# Patient Record
Sex: Male | Born: 1985 | Race: Black or African American | Hispanic: No | Marital: Married | Smoking: Current every day smoker
Health system: Southern US, Community
[De-identification: ages and names within clinical notes are randomized; demographics above are authoritative.]

---

## 2019-11-16 ENCOUNTER — Encounter (HOSPITAL_COMMUNITY): Payer: Self-pay | Admitting: Emergency Medicine

## 2019-11-16 ENCOUNTER — Emergency Department (HOSPITAL_COMMUNITY)
Admission: EM | Admit: 2019-11-16 | Discharge: 2019-11-16 | Disposition: A | Discharge status: Home / Self Care | Payer: Medicaid Other | Attending: Emergency Medicine | Admitting: Emergency Medicine

## 2019-11-16 ENCOUNTER — Other Ambulatory Visit: Payer: Self-pay

## 2019-11-16 ENCOUNTER — Emergency Department (HOSPITAL_COMMUNITY): Payer: Medicaid Other

## 2019-11-16 DIAGNOSIS — F17210 Nicotine dependence, cigarettes, uncomplicated: Secondary | ICD-10-CM | POA: Insufficient documentation

## 2019-11-16 DIAGNOSIS — S91105D Unspecified open wound of left lesser toe(s) without damage to nail, subsequent encounter: Secondary | ICD-10-CM | POA: Diagnosis not present

## 2019-11-16 DIAGNOSIS — S91105A Unspecified open wound of left lesser toe(s) without damage to nail, initial encounter: Secondary | ICD-10-CM

## 2019-11-16 DIAGNOSIS — X58XXXD Exposure to other specified factors, subsequent encounter: Secondary | ICD-10-CM | POA: Insufficient documentation

## 2019-11-16 NOTE — ED Provider Notes (Signed)
[facility] EMERGENCY DEPARTMENT Provider Note   CSN: [id number] Arrival date & time: 11/16/19  0005     History Chief Complaint  Patient presents with  . Foot Pain    Steven Blevins is a 34 y.o. male.  HPI     This is a 34 year old male brought in by police for medical clearance to go to jail.  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  Patient reports that he was admitted to [facility] several months ago and is continuing to take Bactrim for infection.  He states the wound generally looks better than it did.  He has not had any fevers.  He has not noted any drainage.  Wound is stable and he has no complaints.  Records reviewed from [place].  Patient has had a wound on his left toe since December 15.  He has been on multiple courses of antibiotics.  His x-rays do not show any evidence of osteomyelitis.  He was referred to wound clinic from [place].  History reviewed. No pertinent past medical history.  There are no problems to display for this patient.   History reviewed. No pertinent surgical history.     No family history on file.  Social History   Tobacco Use  . Smoking status: Current Every Day Smoker    Types: Cigarettes  . Smokeless tobacco: Never Used  Substance Use Topics  . Alcohol use: Yes  . Drug use: Not Currently    Home Medications Prior to Admission medications   Not on File    Allergies    Patient has no allergy information on record.  Review of Systems   Review of Systems  Constitutional: Negative for fever.  Skin: Positive for wound.  All other systems reviewed and are negative.   Physical Exam Updated Vital Signs BP (!) 132/106   Pulse (!) 59   Temp 97.7 F (36.5 C) (Oral)   Resp 18   Ht 1.829 m (6')   Wt 72.6 kg   SpO2 97%   BMI 21.70 kg/m   Physical Exam Vitals and nursing note reviewed.  Constitutional:      Appearance: He is well-developed. He is not ill-appearing.  HENT:     Head: Normocephalic  and atraumatic.  Cardiovascular:     Rate and Rhythm: Normal rate and regular rhythm.  Pulmonary:     Effort: Pulmonary effort is normal. No respiratory distress.     Breath sounds: No wheezing.  Musculoskeletal:     Cervical back: Neck supple.     Comments: Focused examination of the left foot with well granulated wound over the dorsal aspect of the second digit, no drainage noted, slight darkening of that digit, sensation intact, no crepitus  Lymphadenopathy:     Cervical: No cervical adenopathy.  Skin:    General: Skin is warm and dry.     Comments: See musculoskeletal above  Neurological:     Mental Status: He is alert and oriented to person, place, and time.  Psychiatric:        Mood and Affect: Mood normal.     ED Results / Procedures / Treatments   Labs (all labs ordered are listed, but only abnormal results are displayed) Labs Reviewed - No data to display  EKG None  Radiology DG Toe 2nd Left  Result Date: 11/16/2019 CLINICAL DATA:  Wound. EXAM: LEFT SECOND TOE COMPARISON:  None. FINDINGS: There is no evidence of fracture or dislocation. There is no evidence of  arthropathy or other focal bone abnormality. Soft tissues are unremarkable. IMPRESSION: Negative. Electronically Signed   By: Katherine Mantle M.D.   On: 11/16/2019 01:08    Procedures Procedures (including critical care time)  Medications Ordered in ED Medications - No data to display  ED Course  I have reviewed the triage vital signs and the nursing notes.  Pertinent labs & imaging results that were available during my care of the patient were reviewed by me and considered in my medical decision making (see chart for details).    MDM Rules/Calculators/A&P                       Patient has a chronic wound of the left toe.  Reports that it has gotten better.  Needs clearance to go to jail.  He is currently still on antibiotics.  I have reviewed his outside records and he never had evidence of  osteomyelitis.  Patient repeat x-ray does not show any evidence of focal bone abnormalities to suggest osteomyelitis.  Patient cleared to jail.  Continue antibiotics as previously prescribed.  After history, exam, and medical workup I feel the patient has been appropriately medically screened and is safe for discharge home. Pertinent diagnoses were discussed with the patient. Patient was given return precautions.   Final Clinical Impression(s) / ED Diagnoses Final diagnoses:  Open wound of second toe of left foot, initial encounter    Rx / DC Orders ED Discharge Orders    None       Abriel Geesey, Mayer Masker, MD 11/16/19 0121

## 2019-11-16 NOTE — Discharge Instructions (Addendum)
Patient is medically cleared to go to jail.  He should continue antibiotics as previously prescribed.

## 2019-11-16 NOTE — ED Triage Notes (Signed)
Pt c/o infected toe and was seen at [facility] for the same. Pt is here for medical clearance so he can go to jail.

## 2021-08-16 IMAGING — DX DG TOE 2ND 2+V*L*
3 series · 3 of 3 positions shown · non-contrast
Comparison: None.

CLINICAL DATA: Wound.

EXAM:
LEFT SECOND TOE

[toe ap]
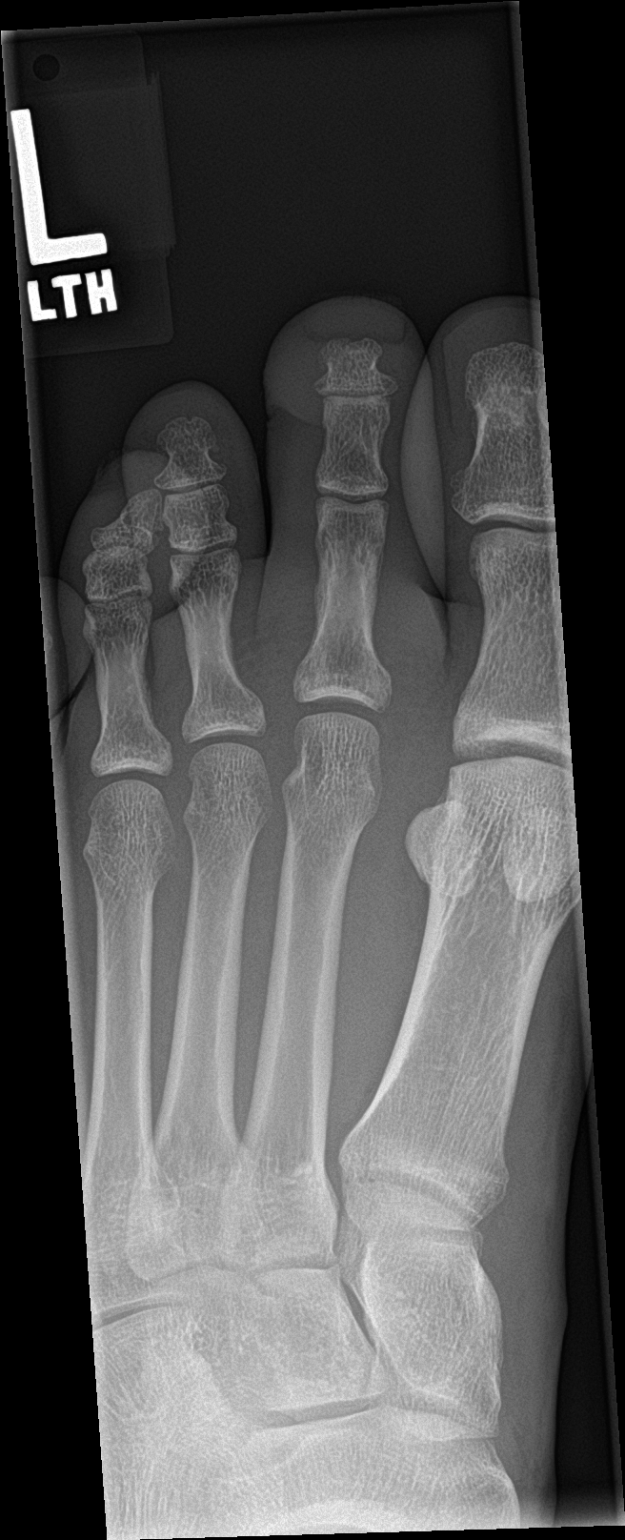

[toe obl]
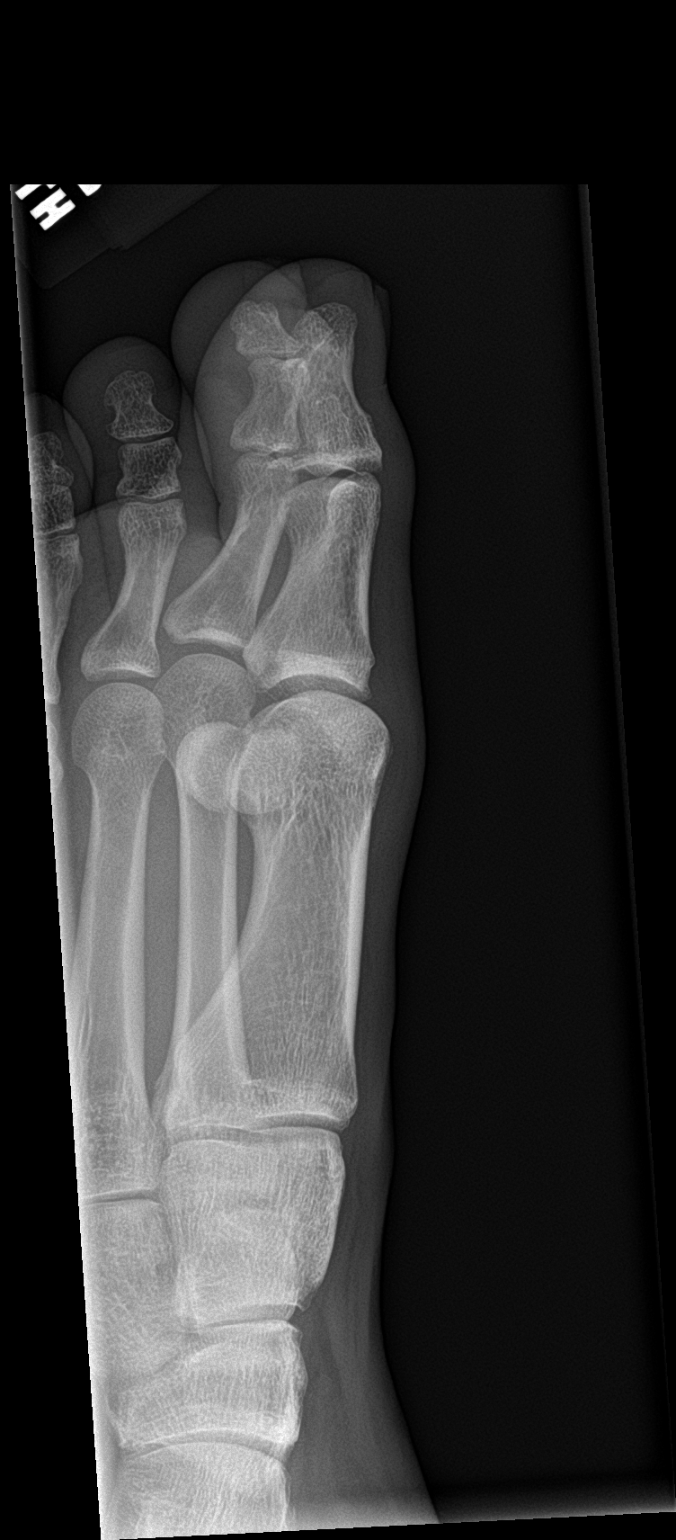

[toe lat]
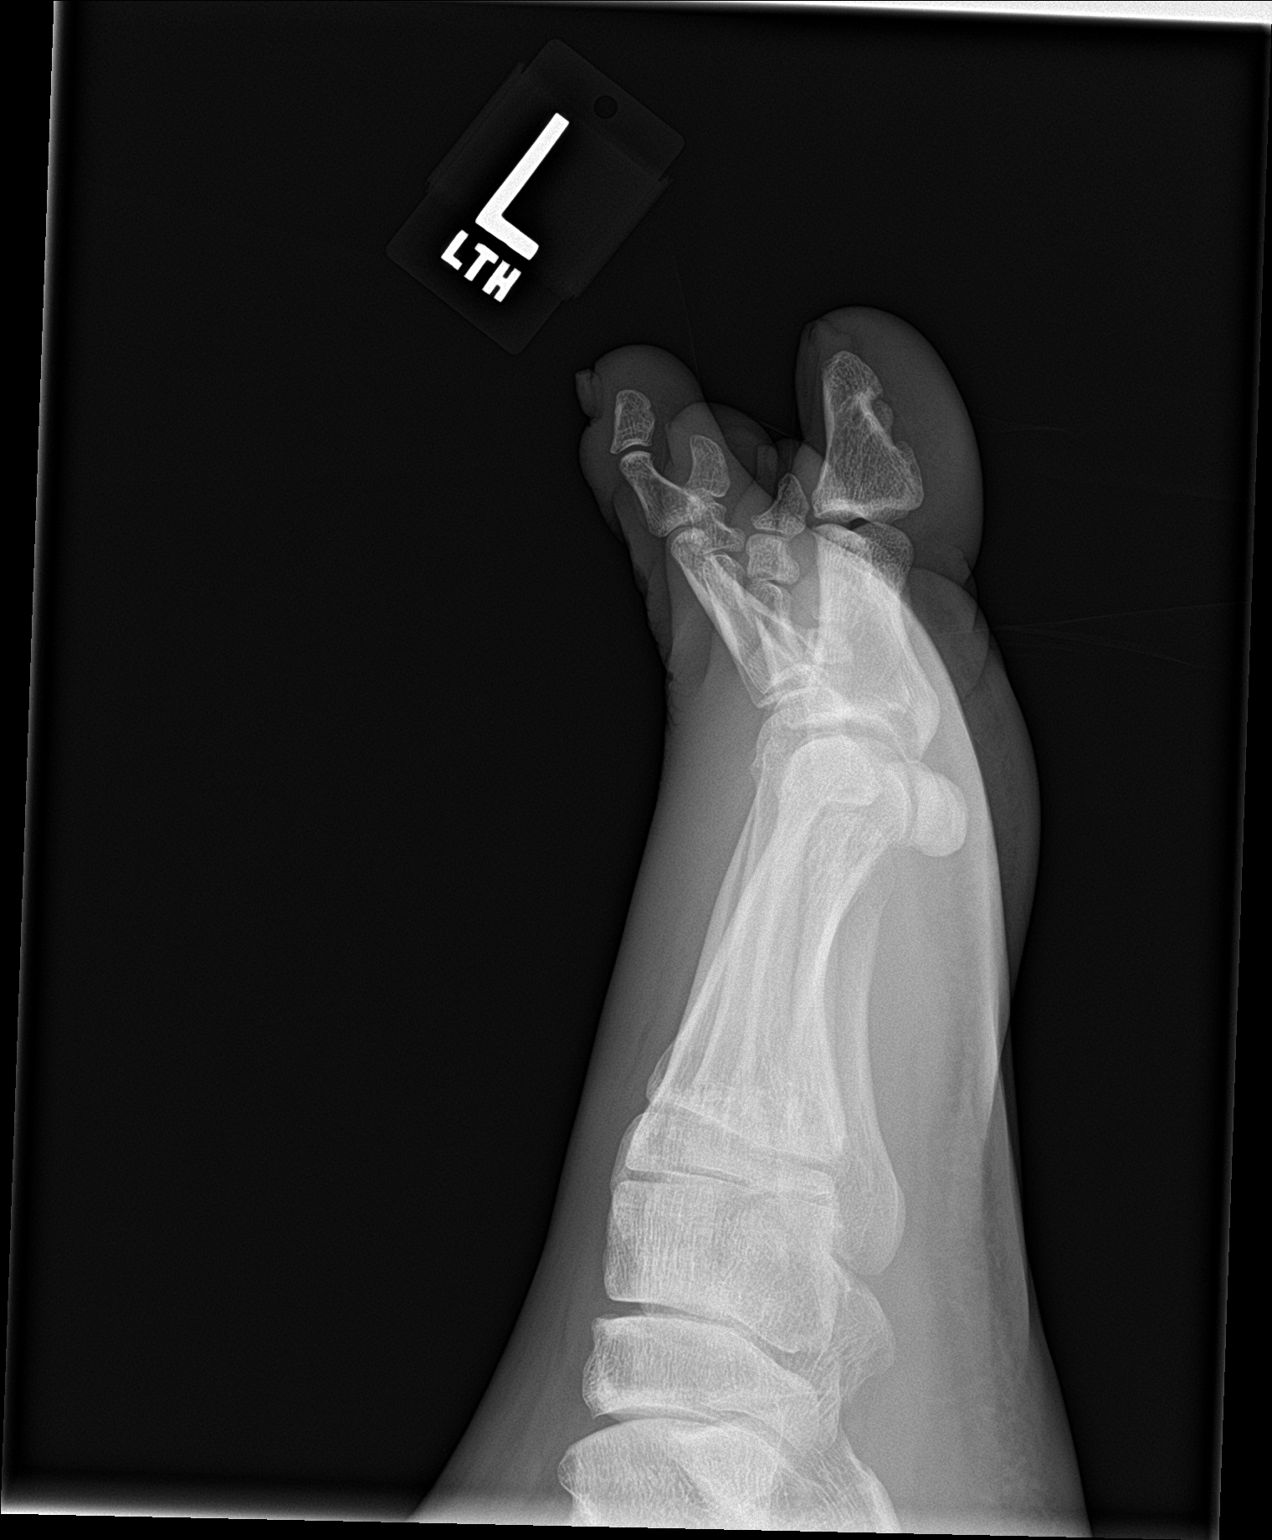

[3 of 3 positions shown; findings below may reference images not displayed]

FINDINGS: There is no evidence of fracture or dislocation. There is no
evidence of arthropathy or other focal bone abnormality. Soft
tissues are unremarkable.
IMPRESSION: Negative.
